# Patient Record
Sex: Female | Born: 1955 | Race: White | Hispanic: No | Marital: Married | Smoking: Former smoker
Health system: Southern US, Community
[De-identification: ages and names within clinical notes are randomized; demographics above are authoritative.]

## PROBLEM LIST (undated history)

## (undated) DIAGNOSIS — R112 Nausea with vomiting, unspecified: Secondary | ICD-10-CM

## (undated) DIAGNOSIS — M1990 Unspecified osteoarthritis, unspecified site: Secondary | ICD-10-CM

## (undated) DIAGNOSIS — E7800 Pure hypercholesterolemia, unspecified: Secondary | ICD-10-CM

## (undated) DIAGNOSIS — Z9889 Other specified postprocedural states: Secondary | ICD-10-CM

## (undated) DIAGNOSIS — F909 Attention-deficit hyperactivity disorder, unspecified type: Secondary | ICD-10-CM

## (undated) HISTORY — PX: FOOT SURGERY: SHX648

## (undated) HISTORY — PX: WRIST FRACTURE SURGERY: SHX121

## (undated) HISTORY — PX: WISDOM TOOTH EXTRACTION: SHX21

## (undated) HISTORY — DX: Nausea with vomiting, unspecified: Z98.890

## (undated) HISTORY — PX: TONSILLECTOMY: SUR1361

## (undated) HISTORY — DX: Unspecified osteoarthritis, unspecified site: M19.90

## (undated) HISTORY — DX: Nausea with vomiting, unspecified: R11.2

---

## 1999-09-11 ENCOUNTER — Encounter: Payer: Self-pay | Admitting: Obstetrics and Gynecology

## 1999-09-11 ENCOUNTER — Encounter: Admission: RE | Admit: 1999-09-11 | Discharge: 1999-09-11 | Payer: Self-pay | Admitting: Obstetrics and Gynecology

## 1999-09-17 ENCOUNTER — Other Ambulatory Visit: Admission: RE | Admit: 1999-09-17 | Discharge: 1999-09-17 | Payer: Self-pay | Admitting: Obstetrics and Gynecology

## 2008-05-11 HISTORY — PX: COLONOSCOPY: SHX174

## 2011-06-04 ENCOUNTER — Other Ambulatory Visit: Payer: Self-pay | Admitting: Obstetrics and Gynecology

## 2011-06-04 DIAGNOSIS — R928 Other abnormal and inconclusive findings on diagnostic imaging of breast: Secondary | ICD-10-CM

## 2011-06-11 ENCOUNTER — Ambulatory Visit
Admission: RE | Admit: 2011-06-11 | Discharge: 2011-06-11 | Disposition: A | Discharge status: Home / Self Care | Payer: BC Managed Care – PPO | Source: Ambulatory Visit | Attending: Obstetrics and Gynecology | Admitting: Obstetrics and Gynecology

## 2011-06-11 DIAGNOSIS — R928 Other abnormal and inconclusive findings on diagnostic imaging of breast: Secondary | ICD-10-CM

## 2012-11-25 ENCOUNTER — Other Ambulatory Visit: Payer: Self-pay | Admitting: Obstetrics and Gynecology

## 2012-11-25 DIAGNOSIS — Z780 Asymptomatic menopausal state: Secondary | ICD-10-CM

## 2013-02-27 ENCOUNTER — Ambulatory Visit
Admission: RE | Admit: 2013-02-27 | Discharge: 2013-02-27 | Disposition: A | Discharge status: Home / Self Care | Payer: BC Managed Care – PPO | Source: Ambulatory Visit | Attending: Obstetrics and Gynecology | Admitting: Obstetrics and Gynecology

## 2013-02-27 DIAGNOSIS — Z780 Asymptomatic menopausal state: Secondary | ICD-10-CM

## 2014-12-21 ENCOUNTER — Other Ambulatory Visit: Payer: Self-pay | Admitting: Obstetrics and Gynecology

## 2014-12-21 DIAGNOSIS — R928 Other abnormal and inconclusive findings on diagnostic imaging of breast: Secondary | ICD-10-CM

## 2014-12-28 ENCOUNTER — Ambulatory Visit
Admission: RE | Admit: 2014-12-28 | Discharge: 2014-12-28 | Disposition: A | Discharge status: Home / Self Care | Payer: BLUE CROSS/BLUE SHIELD | Source: Ambulatory Visit | Attending: Obstetrics and Gynecology | Admitting: Obstetrics and Gynecology

## 2014-12-28 DIAGNOSIS — R928 Other abnormal and inconclusive findings on diagnostic imaging of breast: Secondary | ICD-10-CM

## 2016-07-13 ENCOUNTER — Emergency Department (HOSPITAL_COMMUNITY)
Admission: EM | Admit: 2016-07-13 | Discharge: 2016-07-13 | Disposition: A | Discharge status: Home / Self Care | Payer: BLUE CROSS/BLUE SHIELD | Attending: Emergency Medicine | Admitting: Emergency Medicine

## 2016-07-13 ENCOUNTER — Encounter (HOSPITAL_COMMUNITY): Payer: Self-pay

## 2016-07-13 ENCOUNTER — Emergency Department (HOSPITAL_COMMUNITY): Payer: BLUE CROSS/BLUE SHIELD

## 2016-07-13 DIAGNOSIS — Z87891 Personal history of nicotine dependence: Secondary | ICD-10-CM | POA: Diagnosis not present

## 2016-07-13 DIAGNOSIS — F909 Attention-deficit hyperactivity disorder, unspecified type: Secondary | ICD-10-CM | POA: Diagnosis not present

## 2016-07-13 DIAGNOSIS — R0789 Other chest pain: Secondary | ICD-10-CM | POA: Insufficient documentation

## 2016-07-13 DIAGNOSIS — R072 Precordial pain: Secondary | ICD-10-CM | POA: Diagnosis present

## 2016-07-13 HISTORY — DX: Pure hypercholesterolemia, unspecified: E78.00

## 2016-07-13 HISTORY — DX: Attention-deficit hyperactivity disorder, unspecified type: F90.9

## 2016-07-13 LAB — I-STAT TROPONIN, ED
TROPONIN I, POC: 0 ng/mL (ref 0.00–0.08)
TROPONIN I, POC: 0 ng/mL (ref 0.00–0.08)

## 2016-07-13 LAB — BASIC METABOLIC PANEL WITH GFR
Anion gap: 8 (ref 5–15)
BUN: 17 mg/dL (ref 6–20)
CO2: 24 mmol/L (ref 22–32)
Calcium: 9.4 mg/dL (ref 8.9–10.3)
Chloride: 107 mmol/L (ref 101–111)
Creatinine, Ser: 0.88 mg/dL (ref 0.44–1.00)
GFR calc Af Amer: >60 mL/min
GFR calc non Af Amer: >60 mL/min
Glucose, Bld: 107 mg/dL — ABNORMAL HIGH (ref 65–99)
Potassium: 4.1 mmol/L (ref 3.5–5.1)
Sodium: 139 mmol/L (ref 135–145)

## 2016-07-13 LAB — CBC
HCT: 38.9 % (ref 36.0–46.0)
Hemoglobin: 12.8 g/dL (ref 12.0–15.0)
MCH: 31.2 pg (ref 26.0–34.0)
MCHC: 32.9 g/dL (ref 30.0–36.0)
MCV: 94.9 fL (ref 78.0–100.0)
PLATELETS: 216 10*3/uL (ref 150–400)
RBC: 4.1 MIL/uL (ref 3.87–5.11)
RDW: 12.2 % (ref 11.5–15.5)
WBC: 4.3 10*3/uL (ref 4.0–10.5)

## 2016-07-13 LAB — D-DIMER, QUANTITATIVE: D-Dimer, Quant: 0.29 ug/mL-FEU (ref 0.00–0.50)

## 2016-07-13 NOTE — ED Triage Notes (Signed)
Pt presents via [facility] for evaluation of central chest pressure with sudden onset today. Pt reports improvement with movement/deep breathing but states pain did persist. Pt went to [facility], given 324 ASA. No cardiac hx. Pt reports she is pain free at this time. No associated symptoms.

## 2016-07-13 NOTE — ED Notes (Signed)
Patient transported to X-ray 

## 2016-07-13 NOTE — ED Provider Notes (Signed)
[place] DEPT Provider Note   CSN: [id number] Arrival date & time: 07/13/16  1122     History   Chief Complaint Chief Complaint  Patient presents with  . Chest Pain    HPI Amanda Robles is a 61 y.o. female.   Chest Pain   This is a new problem. Episode frequency: intermittent. The problem has been resolved. The pain is associated with rest. The pain is present in the substernal region. The quality of the pain is described as pressure-like and heavy. The pain does not radiate. Pertinent negatives include no abdominal pain, no back pain, no cough, no fever, no headaches, no lower extremity edema, no malaise/fatigue, no nausea, no orthopnea, no palpitations, no shortness of breath, no sputum production and no vomiting. Risk factors include being elderly.  Her past medical history is significant for hyperlipidemia.  Pertinent negatives for past medical history include no diabetes, no DVT, no hypertension and no MI.  Pertinent negatives for family medical history include: no early MI.  Procedure history is negative for cardiac catheterization and exercise treadmill test.    Past Medical History:  Diagnosis Date  . ADHD   . Hypercholesteremia     There are no active problems to display for this patient.   Past Surgical History:  Procedure Laterality Date  . WRIST FRACTURE SURGERY     R wrist    OB History    No data available       Home Medications    Prior to Admission medications   Not on File    Family History No family history on file.  Social History Social History  Substance Use Topics  . Smoking status: Former Smoker    Types: Cigarettes    Quit date: 05/15/2010  . Smokeless tobacco: Never Used  . Alcohol use 6.0 oz/week    10 Glasses of wine per week     Allergies   Sulfa antibiotics   Review of Systems Review of Systems  Constitutional: Negative for fever and malaise/fatigue.  Respiratory: Negative for cough, sputum production and  shortness of breath.   Cardiovascular: Positive for chest pain. Negative for palpitations and orthopnea.  Gastrointestinal: Negative for abdominal pain, nausea and vomiting.  Musculoskeletal: Negative for back pain.  Neurological: Negative for headaches.  Ten systems are reviewed and are negative for acute change except as noted in the HPI    Physical Exam Updated Vital Signs BP 120/68   Pulse 67   Temp 97.9 F (36.6 C) (Oral)   Resp 17   Ht 5\' 7"  (1.702 m)   Wt 129 lb (58.5 kg)   SpO2 100%   BMI 20.20 kg/m   Physical Exam  Constitutional: She is oriented to person, place, and time. She appears well-developed and well-nourished. No distress.  HENT:  Head: Normocephalic and atraumatic.  Nose: Nose normal.  Eyes: Conjunctivae and EOM are normal. Pupils are equal, round, and reactive to light. Right eye exhibits no discharge. Left eye exhibits no discharge. No scleral icterus.  Neck: Normal range of motion. Neck supple.  Cardiovascular: Normal rate and regular rhythm.  Exam reveals no gallop and no friction rub.   No murmur heard. Pulmonary/Chest: Effort normal and breath sounds normal. No stridor. No respiratory distress. She has no rales.  Abdominal: Soft. She exhibits no distension. There is no tenderness.  Musculoskeletal: She exhibits no edema or tenderness.  Neurological: She is alert and oriented to person, place, and time.  Skin: Skin is warm and  dry. No rash noted. She is not diaphoretic. No erythema.  Psychiatric: She has a normal mood and affect.  Vitals reviewed.    ED Treatments / Results  Labs (all labs ordered are listed, but only abnormal results are displayed) Labs Reviewed  BASIC METABOLIC PANEL - Abnormal; Notable for the following:       Result Value   Glucose, Bld 107 (*)    All other components within normal limits  CBC  D-DIMER, QUANTITATIVE (NOT AT [facility])  I-STAT TROPOININ, ED  I-STAT TROPOININ, ED    EKG  EKG  Interpretation  Date/Time:  Monday July 13 2016 11:27:48 EST Ventricular Rate:  71 PR Interval:    QRS Duration: 87 QT Interval:  416 QTC Calculation: 453 R Axis:   75 Text Interpretation:  Sinus rhythm Borderline short PR interval no STEMI No old tracing to compare Confirmed by [place] ([id number]) on 07/13/2016 12:47:02 PM       Radiology Dg Chest 2 View  Result Date: 07/13/2016 CLINICAL DATA:  Chest pain. EXAM: CHEST  2 VIEW COMPARISON:  None. FINDINGS: The heart size and mediastinal contours are within normal limits. Both lungs are clear. No pneumothorax or pleural effusion is noted. The visualized skeletal structures are unremarkable. IMPRESSION: No active cardiopulmonary disease. Electronically Signed   By: Marijo Conception, M.D.   On: 07/13/2016 12:19    Procedures Procedures (including critical care time)  Medications Ordered in ED Medications - No data to display   Initial Impression / Assessment and Plan / ED Course  I have reviewed the triage vital signs and the nursing notes.  Pertinent labs & imaging results that were available during my care of the patient were reviewed by me and considered in my medical decision making (see chart for details).     Atypical chest pain. EKG without acute ischemic changes or evidence of pericarditis. Initial troponin negative. Chest x-ray without evidence suggestive of pneumonia, pneumothorax, pneumomediastinum.  No abnormal contour of the mediastinum to suggest dissection. No evidence of acute injuries. HEAR <3. Appropriate for Delta trop.  Not able to [facility]. Dimer negative. Doubt PE.  Presentation not classic for aortic dissection or esophageal perforation.   3:03 PM Delta trop negative.   The patient is safe for discharge with strict return precautions.   Final Clinical Impressions(s) / ED Diagnoses   Final diagnoses:  Atypical chest pain   Disposition: Discharge  Condition: Good  I have discussed the results,  Dx and Tx plan with the patient who expressed understanding and agree(s) with the plan. Discharge instructions discussed at great length. The patient was given strict return precautions who verbalized understanding of the instructions. No further questions at time of discharge.    New Prescriptions   No medications on file    Follow Up: primary care provider  Schedule an appointment as soon as possible for a visit  for stress testing within 30 days.      Fatima Blank, MD 07/13/16 [phone]

## 2017-06-04 ENCOUNTER — Encounter (HOSPITAL_COMMUNITY): Payer: Self-pay | Admitting: Emergency Medicine

## 2017-06-04 ENCOUNTER — Emergency Department (HOSPITAL_COMMUNITY): Payer: BLUE CROSS/BLUE SHIELD

## 2017-06-04 ENCOUNTER — Emergency Department (HOSPITAL_COMMUNITY)
Admission: EM | Admit: 2017-06-04 | Discharge: 2017-06-04 | Disposition: A | Discharge status: Home / Self Care | Payer: BLUE CROSS/BLUE SHIELD | Attending: Emergency Medicine | Admitting: Emergency Medicine

## 2017-06-04 ENCOUNTER — Other Ambulatory Visit: Payer: Self-pay

## 2017-06-04 DIAGNOSIS — Y92000 Kitchen of unspecified non-institutional (private) residence as  the place of occurrence of the external cause: Secondary | ICD-10-CM | POA: Diagnosis not present

## 2017-06-04 DIAGNOSIS — F909 Attention-deficit hyperactivity disorder, unspecified type: Secondary | ICD-10-CM | POA: Diagnosis not present

## 2017-06-04 DIAGNOSIS — S92301A Fracture of unspecified metatarsal bone(s), right foot, initial encounter for closed fracture: Secondary | ICD-10-CM | POA: Diagnosis not present

## 2017-06-04 DIAGNOSIS — Y939 Activity, unspecified: Secondary | ICD-10-CM | POA: Insufficient documentation

## 2017-06-04 DIAGNOSIS — Z87891 Personal history of nicotine dependence: Secondary | ICD-10-CM | POA: Insufficient documentation

## 2017-06-04 DIAGNOSIS — S92901A Unspecified fracture of right foot, initial encounter for closed fracture: Secondary | ICD-10-CM | POA: Diagnosis present

## 2017-06-04 DIAGNOSIS — X500XXA Overexertion from strenuous movement or load, initial encounter: Secondary | ICD-10-CM | POA: Diagnosis not present

## 2017-06-04 DIAGNOSIS — Y999 Unspecified external cause status: Secondary | ICD-10-CM | POA: Diagnosis not present

## 2017-06-04 IMAGING — DX DG HIP (WITH OR WITHOUT PELVIS) 2-3V*R*
2 series · 2 of 2 positions shown · non-contrast
Comparison: None.

CLINICAL DATA: Initial evaluation for acute trauma, fall, pain.

EXAM:
DG HIP (WITH OR WITHOUT PELVIS) 2-3V RIGHT

[pelvis ap]
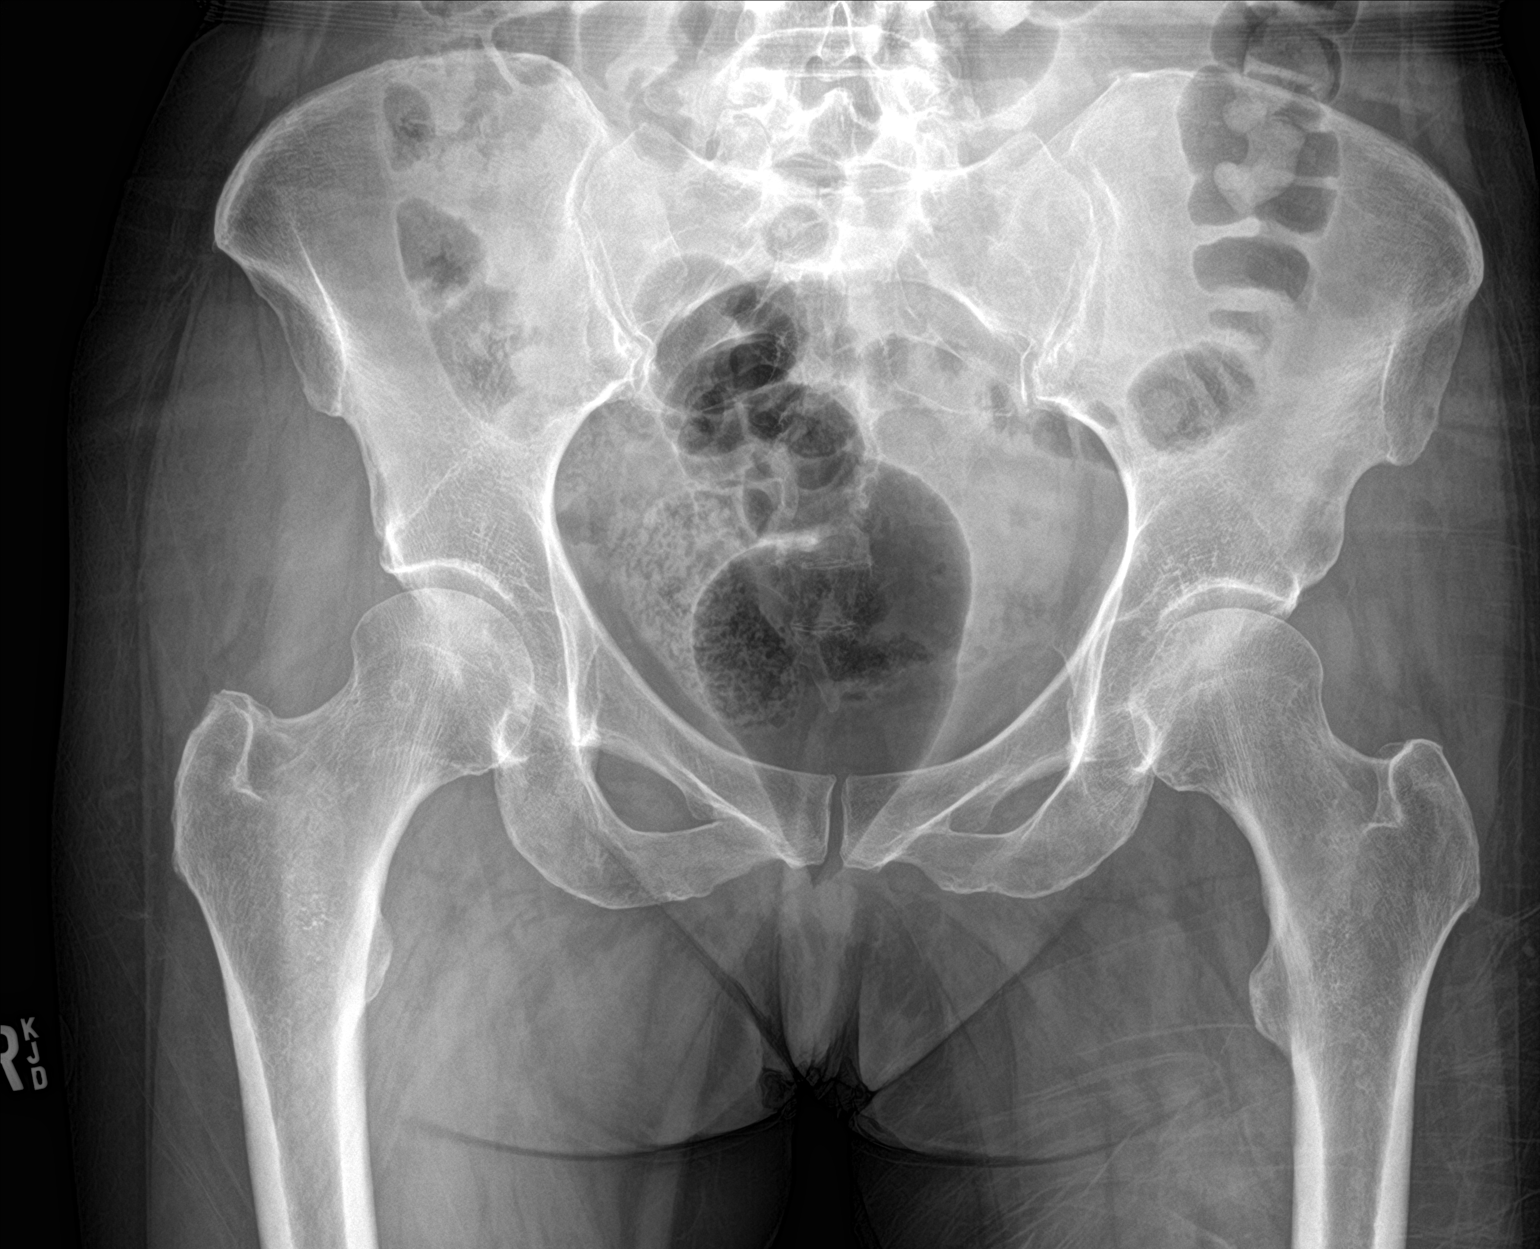

[hip lat]
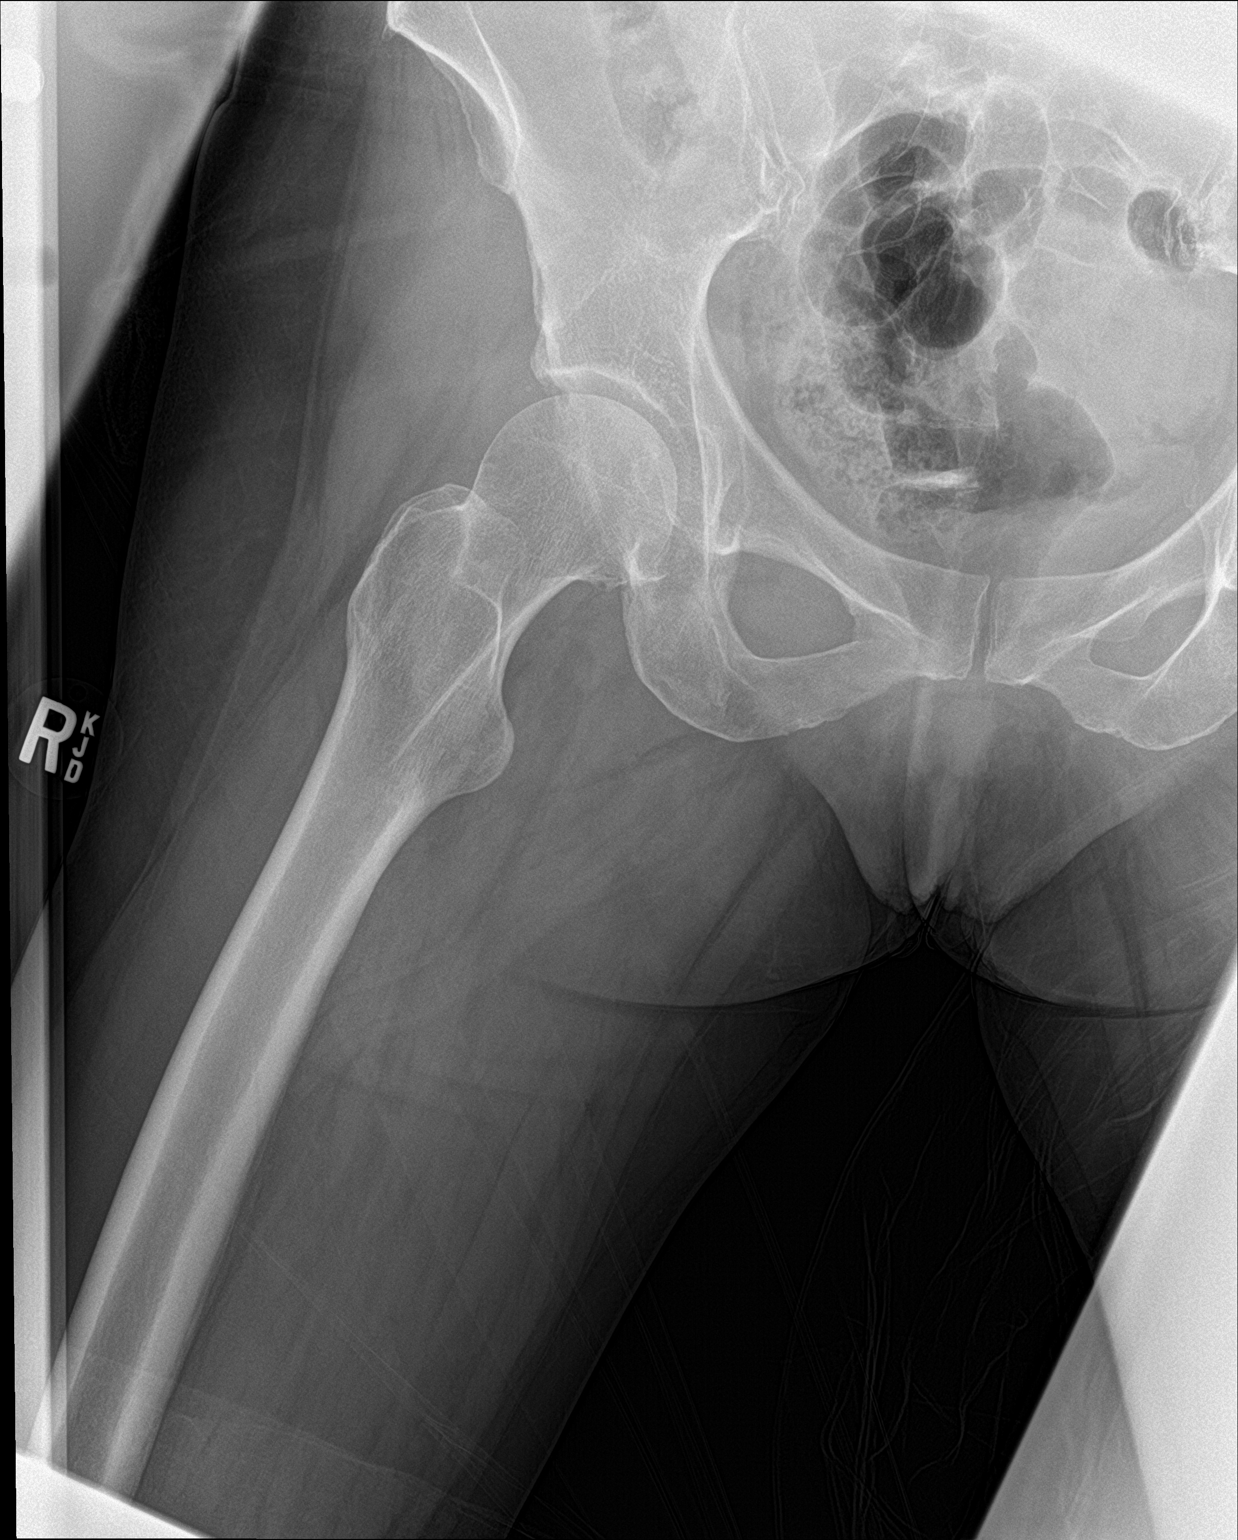

[2 of 2 positions shown; findings below may reference images not displayed]

FINDINGS: No acute fracture dislocation. Femoral head in normal alignment
within the acetabula. Femoral head height preserved. Small synovial
pit noted. Bony pelvis intact. Limited views of the left hip
demonstrate no acute abnormality. No acute soft tissue abnormality.
IMPRESSION: No acute osseous abnormality about the right hip.

## 2017-06-04 MED ORDER — ONDANSETRON 4 MG PO TBDP: 4.0000 mg | ORAL_TABLET | Freq: Four times a day (QID) | ORAL | 0 refills | Status: DC | PRN

## 2017-06-04 MED ORDER — HYDROCODONE-ACETAMINOPHEN 5-325 MG PO TABS: 2.0000 | ORAL_TABLET | Freq: Four times a day (QID) | ORAL | 0 refills | Status: DC | PRN

## 2017-06-04 MED ORDER — HYDROCODONE-ACETAMINOPHEN 5-325 MG PO TABS
2.0000 | ORAL_TABLET | Freq: Once | ORAL | Status: AC
  Administered 2017-06-04: 2 via ORAL
  Filled 2017-06-04: qty 2

## 2017-06-04 MED ORDER — ONDANSETRON 4 MG PO TBDP
4.0000 mg | ORAL_TABLET | Freq: Once | ORAL | Status: AC
Start: 2017-06-04 — End: 2017-06-04
  Administered 2017-06-04: 4 mg via ORAL
  Filled 2017-06-04: qty 1

## 2017-06-04 NOTE — ED Notes (Signed)
Dr. Leonides Schanz ( EDP) explained x-ray result and plan of care to pt.

## 2017-06-04 NOTE — ED Notes (Signed)
Ice pack applied/elevated her right foot for comfort.

## 2017-06-04 NOTE — ED Notes (Signed)
No response in lobby

## 2017-06-04 NOTE — ED Provider Notes (Signed)
TIME SEEN: 4:43 AM  CHIEF COMPLAINT: Right foot and leg pain  HPI: Patient is a 62 year old female with history of hyperlipidemia who states tonight at home she slipped on a rug in her kitchen and twisted her leg falling to the ground.  Complaining of right foot pain, right lateral and proximal tibia knee pain, right hip pain.  Has some tingling in her toes diffusely and swelling and bruising to the dorsal foot.  Did not strike her head or lose consciousness.  Denies neck or back pain.  No chest or abdominal pain.  She is not on any antiplatelets or anticoagulants.  ROS: See HPI Constitutional: no fever  Eyes: no drainage  ENT: no runny nose   Cardiovascular:  no chest pain  Resp: no SOB  GI: no vomiting GU: no dysuria Integumentary: no rash  Allergy: no hives  Musculoskeletal: no leg swelling  Neurological: no slurred speech ROS otherwise negative  PAST MEDICAL HISTORY/PAST SURGICAL HISTORY:  Past Medical History:  Diagnosis Date  . ADHD   . Hypercholesteremia     MEDICATIONS:  Prior to Admission medications   Not on File    ALLERGIES:  Allergies  Allergen Reactions  . Sulfa Antibiotics     SOCIAL HISTORY:  Social History   Tobacco Use  . Smoking status: Former Smoker    Types: Cigarettes    Last attempt to quit: 05/15/2010    Years since quitting: 7.0  . Smokeless tobacco: Never Used  Substance Use Topics  . Alcohol use: Yes    Alcohol/week: 6.0 oz    Types: 10 Glasses of wine per week    FAMILY HISTORY: No family history on file.  EXAM: BP 137/74 (BP Location: Right Arm)   Pulse 81   Temp 97.7 F (36.5 C) (Oral)   Resp 16   Ht 5' 7.5" (1.715 m)   Wt 62.6 kg (138 lb)   SpO2 100%   BMI 21.29 kg/m  CONSTITUTIONAL: Alert and oriented and responds appropriately to questions. Well-appearing; well-nourished; GCS 15 HEAD: Normocephalic; atraumatic EYES: Conjunctivae clear, PERRL, EOMI ENT: normal nose; no rhinorrhea; moist mucous membranes; pharynx  without lesions noted; no dental injury; no septal hematoma NECK: Supple, no meningismus, no LAD; no midline spinal tenderness, step-off or deformity; trachea midline CARD: RRR; S1 and S2 appreciated; no murmurs, no clicks, no rubs, no gallops RESP: Normal chest excursion without splinting or tachypnea; breath sounds clear and equal bilaterally; no wheezes, no rhonchi, no rales; no hypoxia or respiratory distress CHEST:  chest wall stable, no crepitus or ecchymosis or deformity, nontender to palpation; no flail chest ABD/GI: Normal bowel sounds; non-distended; soft, non-tender, no rebound, no guarding; no ecchymosis or other lesions noted PELVIS:  stable, nontender to palpation BACK:  The back appears normal and is non-tender to palpation, there is no CVA tenderness; no midline spinal tenderness, step-off or deformity EXT: Patient is tender to palpation over the right dorsal foot diffusely with swelling and ecchymosis.  Reports decreased sensation in the tips of her toes but otherwise normal sensation throughout the foot.  Her extremities warm and well perfused with 2+ right DP pulse.  Also tender over the right lateral proximal tibia, diffusely over the anterior right knee and over the right lateral hip.  She does have some soft tissue swelling noted to the right knee but no ecchymosis or joint effusion.  Compartments are soft.  Otherwise her extremities are nontender to palpation. SKIN: Normal color for age and race; warm NEURO: Moves  all extremities equally, sensation to light touch is intact diffusely PSYCH: The patient's mood and manner are appropriate. Grooming and personal hygiene are appropriate.  MEDICAL DECISION MAKING: Patient here after mechanical fall.  Patient has a chip fracture off the right first proximal phalanx with intra-articular extension.  She also has oblique mildly displaced fractures of the distal right second and third metatarsal bones.  Will obtain x-rays of the right tibia  and fibula, right knee and right hip.  No tenderness over the ankle or femur.  Neurovascularly intact distally other than some mild tingling in her toes likely from swelling.  No sign of compartment syndrome.  States that she normally sees [place] orth[facility]ics.  Will give Vicodin, Zofran for symptomatic relief.  No other sign of trauma on exam.  ED PROGRESS: Other x-ray showed no acute abnormality.  She has significant arthritis of her right knee which she was aware of.  We will splint her foot and have her follow-up with [facility] orthopedics as an outpatient.  Will discharge with Vicodin and Zofran.  Discussed rest, elevation, ice.   At this time, I do not feel there is any life-threatening condition present. I have reviewed and discussed all results (EKG, imaging, lab, urine as appropriate) and exam findings with patient/family. I have reviewed nursing notes and appropriate previous records.  I feel the patient is safe to be discharged home without further emergent workup and can continue workup as an outpatient as needed. Discussed usual and customary return precautions. Patient/family verbalize understanding and are comfortable with this plan.  Outpatient follow-up has been provided if needed. All questions have been answered.    SPLINT APPLICATION Date/Time: 5:91 AM Authorized by: Cyril Mourning Ward Consent: Verbal consent obtained. Risks and benefits: risks, benefits and alternatives were discussed Consent given by: patient Splint applied by: orthopedic technician Location details: right leg Splint type: right short leg posterior splint Supplies used: fiberglass Post-procedure: The splinted body part was neurovascularly unchanged following the procedure. Patient tolerance: Patient tolerated the procedure well with no immediate complications.        Ward, Delice Bison, DO 06/04/17 [phone]

## 2017-06-04 NOTE — ED Notes (Signed)
Ortho tech notified on crutches/splint orders.

## 2017-06-04 NOTE — ED Triage Notes (Signed)
Pt had a mechanical slip in her kitchen injuring her right foot. She has swelling to her toes and foot. There is swelling and discoloration.  Pt took 600mg  of ibuprofen which has decreased the pain.

## 2017-06-08 ENCOUNTER — Encounter (HOSPITAL_BASED_OUTPATIENT_CLINIC_OR_DEPARTMENT_OTHER): Payer: Self-pay | Admitting: *Deleted

## 2017-06-08 ENCOUNTER — Emergency Department (HOSPITAL_BASED_OUTPATIENT_CLINIC_OR_DEPARTMENT_OTHER): Payer: BLUE CROSS/BLUE SHIELD

## 2017-06-08 ENCOUNTER — Other Ambulatory Visit: Payer: Self-pay

## 2017-06-08 ENCOUNTER — Emergency Department (HOSPITAL_BASED_OUTPATIENT_CLINIC_OR_DEPARTMENT_OTHER)
Admission: EM | Admit: 2017-06-08 | Discharge: 2017-06-08 | Disposition: A | Discharge status: Home / Self Care | Payer: BLUE CROSS/BLUE SHIELD | Attending: Emergency Medicine | Admitting: Emergency Medicine

## 2017-06-08 DIAGNOSIS — F909 Attention-deficit hyperactivity disorder, unspecified type: Secondary | ICD-10-CM | POA: Diagnosis not present

## 2017-06-08 DIAGNOSIS — Y998 Other external cause status: Secondary | ICD-10-CM | POA: Insufficient documentation

## 2017-06-08 DIAGNOSIS — S6991XA Unspecified injury of right wrist, hand and finger(s), initial encounter: Secondary | ICD-10-CM | POA: Diagnosis not present

## 2017-06-08 DIAGNOSIS — Y9389 Activity, other specified: Secondary | ICD-10-CM | POA: Diagnosis not present

## 2017-06-08 DIAGNOSIS — Y929 Unspecified place or not applicable: Secondary | ICD-10-CM | POA: Insufficient documentation

## 2017-06-08 DIAGNOSIS — W010XXA Fall on same level from slipping, tripping and stumbling without subsequent striking against object, initial encounter: Secondary | ICD-10-CM | POA: Diagnosis not present

## 2017-06-08 DIAGNOSIS — Z87891 Personal history of nicotine dependence: Secondary | ICD-10-CM | POA: Diagnosis not present

## 2017-06-08 NOTE — ED Notes (Signed)
NAD at this time. Pt is stable and going home.  

## 2017-06-08 NOTE — Discharge Instructions (Addendum)
Follow-up with orthopedics as scheduled on Friday.

## 2017-06-08 NOTE — ED Triage Notes (Signed)
She was walking with crutches, lost her balance and fell. Injury to her right hand.

## 2017-06-08 NOTE — ED Provider Notes (Signed)
[place] EMERGENCY DEPARTMENT Provider Note   CSN: [id number] Arrival date & time: 06/08/17  1445     History   Chief Complaint Chief Complaint  Patient presents with  . Fall  . Hand Injury    HPI Amanda Robles is a 62 y.o. female.  Patient seen on 06/08/17 after a fall at home, resulting in a right foot fracture. She was discharged after splint application with crutches for ambulation. Patient states she has been having difficulty managing with the crutches, with several resulting falls. She lost her balance today while using the crutches, and fell, injuring her right hand. She has tenderness and swelling at the base of the right thumb.   The history is provided by the patient. No language interpreter was used.  Fall  This is a new problem. The problem has not changed since onset. Hand Injury      Past Medical History:  Diagnosis Date  . ADHD   . Hypercholesteremia     There are no active problems to display for this patient.   Past Surgical History:  Procedure Laterality Date  . WRIST FRACTURE SURGERY     R wrist    OB History    No data available       Home Medications    Prior to Admission medications   Medication Sig Start Date End Date Taking? Authorizing Provider  HYDROcodone-acetaminophen (NORCO/VICODIN) 5-325 MG tablet Take 2 tablets by mouth every 6 (six) hours as needed. 06/04/17   Ward, Delice Bison, DO  ondansetron (ZOFRAN ODT) 4 MG disintegrating tablet Take 1 tablet (4 mg total) by mouth every 6 (six) hours as needed for nausea or vomiting. 06/04/17   Ward, Delice Bison, DO    Family History No family history on file.  Social History Social History   Tobacco Use  . Smoking status: Former Smoker    Types: Cigarettes    Last attempt to quit: 05/15/2010    Years since quitting: 7.0  . Smokeless tobacco: Never Used  Substance Use Topics  . Alcohol use: Yes    Alcohol/week: 6.0 oz    Types: 10 Glasses of wine per week  . Drug  use: No     Allergies   Sulfa antibiotics   Review of Systems Review of Systems  All other systems reviewed and are negative.    Physical Exam Updated Vital Signs BP (!) 141/91 (BP Location: Left Arm)   Pulse (!) 116   Temp 98.3 F (36.8 C) (Oral)   Resp 18   Ht 5' 7.5" (1.715 m)   Wt 61.2 kg (135 lb)   SpO2 100%   BMI 20.83 kg/m   Physical Exam  Constitutional: She is oriented to person, place, and time. She appears well-developed and well-nourished.  HENT:  Head: Atraumatic.  Eyes: Conjunctivae are normal.  Neck: Neck supple.  Cardiovascular: Normal rate and regular rhythm.  Pulmonary/Chest: Effort normal and breath sounds normal.  Abdominal: Soft. Bowel sounds are normal.  Musculoskeletal: She exhibits tenderness. She exhibits no deformity.       Right hand: She exhibits decreased range of motion and tenderness. She exhibits normal capillary refill and no deformity.       Hands: Splint on right foot.  Neurological: She is alert and oriented to person, place, and time.  Skin: Skin is warm and dry.  Psychiatric: She has a normal mood and affect.  Nursing note and vitals reviewed.    ED Treatments / Results  Labs (all labs ordered are listed, but only abnormal results are displayed) Labs Reviewed - No data to display  EKG  EKG Interpretation None       Radiology Dg Hand Complete Right  Result Date: 06/08/2017 CLINICAL DATA:  Fall. Pain at the first metacarpal. Remote wrist injury. EXAM: RIGHT HAND - COMPLETE 3+ VIEW COMPARISON:  None. FINDINGS: Soft tissue swelling is present at the first metacarpal. There is a small bone fragment at the first [facility] joint. No definite donor site is present. The joint is located. No additional fractures are present. IMPRESSION: 1. Bone fragment at the first [facility] joint with associated soft tissues is concerning for a small avulsion. 2. No other acute fracture. Electronically Signed   By: San Morelle M.D.   On:  06/08/2017 15:18    Procedures Procedures (including critical care time)  Medications Ordered in ED Medications - No data to display   Initial Impression / Assessment and Plan / ED Course  I have reviewed the triage vital signs and the nursing notes.  Pertinent labs & imaging results that were available during my care of the patient were reviewed by me and considered in my medical decision making (see chart for details).      Patient with right thumb pain after a fall. Questionable small bone fragment at [facility] joint. Splint applied. Follow-up with orthopedics (patient has scheduled visit on Friday for previous injury to foot). Care instructions and return precautions discussed. Patient appears safe for discharge at this time.  Final Clinical Impressions(s) / ED Diagnoses   Final diagnoses:  Injury of right thumb, initial encounter    ED Discharge Orders    None       Etta Quill, NP 06/08/17 1725    Tegeler, Gwenyth Allegra, MD 06/09/17 [phone]

## 2019-08-24 ENCOUNTER — Ambulatory Visit: Payer: BC Managed Care – PPO | Attending: Internal Medicine

## 2019-08-24 DIAGNOSIS — Z23 Encounter for immunization: Secondary | ICD-10-CM

## 2019-08-24 NOTE — Progress Notes (Signed)
   Covid-19 Vaccination Clinic  Name:  Amanda Robles    MRN: [id number] DOB: [birth year]  08/24/2019  Amanda Robles was observed post Covid-19 immunization for 15 minutes without incident. She was provided with Vaccine Information Sheet and instruction to access the V-Safe system.   Amanda Robles was instructed to call 911 with any severe reactions post vaccine: Marland Kitchen Difficulty breathing  . Swelling of face and throat  . A fast heartbeat  . A bad rash all over body  . Dizziness and weakness   Immunizations Administered    Name Date Dose VIS Date Route   Pfizer COVID-19 Vaccine 08/24/2019  4:48 PM 0.3 mL 04/21/2019 Intramuscular   Manufacturer: [place]   Lot: [id number]   [place]: [id number]

## 2019-08-26 ENCOUNTER — Ambulatory Visit: Payer: BC Managed Care – PPO

## 2019-09-18 ENCOUNTER — Ambulatory Visit: Payer: BC Managed Care – PPO | Attending: Internal Medicine

## 2019-09-18 DIAGNOSIS — Z23 Encounter for immunization: Secondary | ICD-10-CM

## 2019-09-18 NOTE — Progress Notes (Signed)
   Covid-19 Vaccination Clinic  Name:  Amanda Robles    MRN: [id number] DOB: 1956/02/24  09/18/2019  [name] was observed post Covid-19 immunization for 15 minutes without incident. She was provided with Vaccine Information Sheet and instruction to access the V-Safe system.   [name] was instructed to call 911 with any severe reactions post vaccine: Marland Kitchen Difficulty breathing  . Swelling of face and throat  . A fast heartbeat  . A bad rash all over body  . Dizziness and weakness   Immunizations Administered    Name Date Dose VIS Date Route   Pfizer COVID-19 Vaccine 09/18/2019  8:51 AM 0.3 mL 07/05/2018 Intramuscular   Manufacturer: [place]   Lot: [id number]   [place]: [id number]

## 2021-03-17 ENCOUNTER — Other Ambulatory Visit: Payer: Self-pay | Admitting: Obstetrics and Gynecology

## 2021-03-17 DIAGNOSIS — E2839 Other primary ovarian failure: Secondary | ICD-10-CM

## 2021-06-18 ENCOUNTER — Encounter: Payer: Self-pay | Admitting: Gastroenterology

## 2021-07-04 ENCOUNTER — Ambulatory Visit (AMBULATORY_SURGERY_CENTER): Payer: BC Managed Care – PPO

## 2021-07-04 ENCOUNTER — Other Ambulatory Visit: Payer: Self-pay

## 2021-07-04 VITALS — Ht 67.5 in | Wt 124.0 lb

## 2021-07-04 DIAGNOSIS — Z1211 Encounter for screening for malignant neoplasm of colon: Secondary | ICD-10-CM

## 2021-07-04 MED ORDER — SUTAB 1479-225-188 MG PO TABS: 1.0000 | ORAL_TABLET | ORAL | 0 refills | Status: DC

## 2021-07-04 NOTE — Progress Notes (Signed)
No egg or soy allergy known to patient  No issues known to pt with past sedation with any surgeries or procedures Patient denies ever being told they had issues or difficulty with intubation  No FH of Malignant Hyperthermia Pt is not on diet pills Pt is not on home 02  Pt is not on blood thinners  Pt denies issues with constipation;  No A fib or A flutter Pt is fully vaccinated for Covid x 2 + booster; Medicare coupon sent to patient; NO PA's for preps discussed with pt in PV today  Discussed with pt there will be an out-of-pocket cost for prep and that varies from $0 to 70 + dollars - pt verbalized understanding  Due to the COVID-19 pandemic we are asking patients to follow certain guidelines in PV and the [place]   Pt aware of COVID protocols and [facility] guidelines  PV completed over the phone. Pt verified name, DOB, address and insurance during PV today.  Pt mailed instruction packet with copy of consent form to read and not return, and instructions.  Pt encouraged to call with questions or issues.  If pt has My chart, procedure instructions sent via My Chart   Patient requested to come pick up packet of information;

## 2021-07-18 ENCOUNTER — Encounter: Payer: Self-pay | Admitting: Gastroenterology

## 2021-07-18 ENCOUNTER — Ambulatory Visit (AMBULATORY_SURGERY_CENTER): Payer: Medicare Other | Admitting: Gastroenterology

## 2021-07-18 ENCOUNTER — Other Ambulatory Visit: Payer: Self-pay

## 2021-07-18 VITALS — BP 108/54 | HR 69 | Temp 98.4°F | Resp 11 | Ht 67.5 in | Wt 124.0 lb

## 2021-07-18 DIAGNOSIS — D125 Benign neoplasm of sigmoid colon: Secondary | ICD-10-CM

## 2021-07-18 DIAGNOSIS — K635 Polyp of colon: Secondary | ICD-10-CM | POA: Diagnosis not present

## 2021-07-18 DIAGNOSIS — K640 First degree hemorrhoids: Secondary | ICD-10-CM

## 2021-07-18 DIAGNOSIS — Z1211 Encounter for screening for malignant neoplasm of colon: Secondary | ICD-10-CM

## 2021-07-18 MED ORDER — SODIUM CHLORIDE 0.9 % IV SOLN: 500.0000 mL | INTRAVENOUS | Status: DC

## 2021-07-18 NOTE — Patient Instructions (Signed)
Resume previous medications.  1 polyp removed and sent to pathology.  Await results for final recommendations.  Handouts on findings given to patient ( hemorrhoids, polyps)  ? ?YOU HAD AN ENDOSCOPIC PROCEDURE TODAY AT [facility]:   Refer to the procedure report that was given to you for any specific questions about what was found during the examination.  If the procedure report does not answer your questions, please call your gastroenterologist to clarify.  If you requested that your care partner not be given the details of your procedure findings, then the procedure report has been included in a sealed envelope for you to review at your convenience later. ? ?YOU SHOULD EXPECT: Some feelings of bloating in the abdomen. Passage of more gas than usual.  Walking can help get rid of the air that was put into your GI tract during the procedure and reduce the bloating. If you had a lower endoscopy (such as a colonoscopy or flexible sigmoidoscopy) you may notice spotting of blood in your stool or on the toilet paper. If you underwent a bowel prep for your procedure, you may not have a normal bowel movement for a few days. ? ?Please Note:  You might notice some irritation and congestion in your nose or some drainage.  This is from the oxygen used during your procedure.  There is no need for concern and it should clear up in a day or so. ? ?SYMPTOMS TO REPORT IMMEDIATELY: ? ?Following lower endoscopy (colonoscopy or flexible sigmoidoscopy): ? Excessive amounts of blood in the stool ? Significant tenderness or worsening of abdominal pains ? Swelling of the abdomen that is new, acute ? Fever of 100?F or higher ? ? ?For urgent or emergent issues, a gastroenterologist can be reached at any hour by calling [phone]. ?Do not use MyChart messaging for urgent concerns.  ? ? ?DIET:  We do recommend a small meal at first, but then you may proceed to your regular diet.  Drink plenty of fluids but you should  avoid alcoholic beverages for 24 hours. ? ?ACTIVITY:  You should plan to take it easy for the rest of today and you should NOT DRIVE or use heavy machinery until tomorrow (because of the sedation medicines used during the test).   ? ?FOLLOW UP: ?Our staff will call the number listed on your records 48-72 hours following your procedure to check on you and address any questions or concerns that you may have regarding the information given to you following your procedure. If we do not reach you, we will leave a message.  We will attempt to reach you two times.  During this call, we will ask if you have developed any symptoms of COVID 19. If you develop any symptoms (ie: fever, flu-like symptoms, shortness of breath, cough etc.) before then, please call [phone].  If you test positive for Covid 19 in the 2 weeks post procedure, please call and report this information to [place].   ? ?If any biopsies were taken you will be contacted by phone or by letter within the next 1-3 weeks.  Please call us at [phone] if you have not heard about the biopsies in 3 weeks.  ? ? ?SIGNATURES/CONFIDENTIALITY: ?You and/or your care partner have signed paperwork which will be entered into your electronic medical record.  These signatures attest to the fact that that the information above on your After Visit Summary has been reviewed and is understood.  Full responsibility of the confidentiality  of this discharge information lies with you and/or your care-partner.  ?

## 2021-07-18 NOTE — Progress Notes (Signed)
? ?GASTROENTEROLOGY PROCEDURE H&P NOTE  ? ?Primary Care Physician: ?Ardith Dark, PA-C ? ? ? ?Reason for Procedure:  Colon Cancer screening ? ?Plan:    Colonoscopy ? ?Patient is appropriate for endoscopic procedure(s) in the ambulatory ([facility]) setting. ? ?The nature of the procedure, as well as the risks, benefits, and alternatives were carefully and thoroughly reviewed with the patient. Ample time for discussion and questions allowed. The patient understood, was satisfied, and agreed to proceed.  ? ? ? ?HPI: ?Amanda Robles is a 66 y.o. female who presents for colonoscopy for routine Colon Cancer screening.  No active GI symptoms.   ? ?Past Medical History:  ?Diagnosis Date  ? ADHD   ? Arthritis   ? fingers/ bilateral knees  ? Hypercholesteremia   ? PONV (postoperative nausea and vomiting)   ? ? ?Past Surgical History:  ?Procedure Laterality Date  ? COLONOSCOPY  2010  ? FOOT SURGERY Bilateral   ? LEFT=2020, RIGHT=2021  ? TONSILLECTOMY    ? WISDOM TOOTH EXTRACTION    ? WRIST FRACTURE SURGERY Right   ? ? ?Prior to Admission medications   ?Medication Sig Start Date End Date Taking? Authorizing Provider  ?amphetamine-dextroamphetamine (ADDERALL) 30 MG tablet Take 1 tablet by mouth 2 (two) times daily. 07/01/21  Yes [provider]  ?atorvastatin (LIPITOR) 20 MG tablet Take 20 mg by mouth daily. 05/21/21  Yes [provider]  ?CALCIUM PO Take 1 tablet by mouth daily. GUMMIES   Yes [provider]  ?Multiple Vitamin (MULTIVITAMIN) capsule Take 1 capsule by mouth daily. GUMMIES   Yes [provider]  ? ? ?Current Outpatient Medications  ?Medication Sig Dispense Refill  ? amphetamine-dextroamphetamine (ADDERALL) 30 MG tablet Take 1 tablet by mouth 2 (two) times daily.    ? atorvastatin (LIPITOR) 20 MG tablet Take 20 mg by mouth daily.    ? CALCIUM PO Take 1 tablet by mouth daily. GUMMIES    ? Multiple Vitamin (MULTIVITAMIN) capsule Take 1 capsule by mouth daily. GUMMIES     ? ?Current Facility-Administered Medications  ?Medication Dose Route Frequency Provider Last Rate Last Admin  ? 0.9 %  sodium chloride infusion  500 mL Intravenous Continuous Shateka Petrea V, DO      ? ? ?Allergies as of 07/18/2021 - Review Complete 07/18/2021  ?Allergen Reaction Noted  ? Sulfa antibiotics Other (See Comments) 10/25/2013  ? ? ?Family History  ?Problem Relation Age of Onset  ? Colon cancer Neg Hx   ? Colon polyps Neg Hx   ? Esophageal cancer Neg Hx   ? Stomach cancer Neg Hx   ? Rectal cancer Neg Hx   ? ? ?Social History  ? ?Socioeconomic History  ? Marital status: Married  ?  Spouse name: Not on file  ? Number of children: Not on file  ? Years of education: Not on file  ? Highest education level: Not on file  ?Occupational History  ? Not on file  ?Tobacco Use  ? Smoking status: Former  ?  Types: Cigarettes  ?  Quit date: 05/15/2010  ?  Years since quitting: 11.1  ? Smokeless tobacco: Never  ?Vaping Use  ? Vaping Use: Never used  ?Substance and Sexual Activity  ? Alcohol use: Yes  ?  Alcohol/week: 10.0 - 14.0 standard drinks  ?  Types: 10 - 14 Standard drinks or equivalent per week  ? Drug use: No  ? Sexual activity: Not on file  ?Other Topics Concern  ? Not on file  ?  Social History Narrative  ? Not on file  ? ?Social Determinants of Health  ? ?Financial Resource Strain: Not on file  ?Food Insecurity: Not on file  ?Transportation Needs: Not on file  ?Physical Activity: Not on file  ?Stress: Not on file  ?Social Connections: Not on file  ?Intimate Partner Violence: Not on file  ? ? ?Physical Exam: ?Vital signs in last 24 hours: ?'@BP'$  126/64   Pulse 65   Temp 98.4 ?F (36.9 ?C) (Temporal)   Resp 16   Ht 5' 7.5" (1.715 m)   Wt 124 lb (56.2 kg)   SpO2 100%   BMI 19.13 kg/m?  ?GEN: NAD ?EYE: Sclerae anicteric ?ENT: MMM ?CV: Non-tachycardic ?Pulm: CTA b/l ?GI: Soft, NT/ND ?NEURO:  Alert & Oriented x 3 ? ? ?Gerrit Heck, DO ?[facility] ? ? ?07/18/2021 11:14 AM ? ?

## 2021-07-18 NOTE — Op Note (Signed)
[facility] ?Patient Name: Amanda Robles ?Procedure Date: 07/18/2021 11:07 AM ?MRN: [id number] ?Endoscopist: Gerrit Heck , MD ?Age: 66 ?Referring MD:  ?Date of Birth: [birth year] ?Gender: Female ?Account #: [id number] ?Procedure:                Colonoscopy ?Indications:              Screening for colorectal malignant neoplasm (last  ?                          colonoscopy was more than 10 years ago) ?Medicines:                Monitored Anesthesia Care ?Procedure:                Pre-Anesthesia Assessment: ?                          - Prior to the procedure, a History and Physical  ?                          was performed, and patient medications and  ?                          allergies were reviewed. The patient's tolerance of  ?                          previous anesthesia was also reviewed. The risks  ?                          and benefits of the procedure and the sedation  ?                          options and risks were discussed with the patient.  ?                          All questions were answered, and informed consent  ?                          was obtained. Prior Anticoagulants: The patient has  ?                          taken no previous anticoagulant or antiplatelet  ?                          agents. ASA Grade Assessment: II - A patient with  ?                          mild systemic disease. After reviewing the risks  ?                          and benefits, the patient was deemed in  ?                          satisfactory condition to undergo the procedure. ?  After obtaining informed consent, the colonoscope  ?                          was passed under direct vision. Throughout the  ?                          procedure, the patient's blood pressure, pulse, and  ?                          oxygen saturations were monitored continuously. The  ?                          Olympus CF-[id number] (#[id number]) Colonoscope was  ?                          introduced through  the anus and advanced to the the  ?                          terminal ileum. The colonoscopy was performed  ?                          without difficulty. The patient tolerated the  ?                          procedure well. The quality of the bowel  ?                          preparation was good. The terminal ileum, ileocecal  ?                          valve, appendiceal orifice, and rectum were  ?                          photographed. ?Scope In: 11:22:43 AM ?Scope Out: 11:38:50 AM ?Scope Withdrawal Time: 0 hours 12 minutes 39 seconds  ?Total Procedure Duration: 0 hours 16 minutes 7 seconds  ?Findings:                 The perianal and digital rectal examinations were  ?                          normal. ?                          A 3 mm polyp was found in the sigmoid colon. The  ?                          polyp was sessile. The polyp was removed with a  ?                          cold snare. Resection and retrieval were complete.  ?                          Estimated blood loss was minimal. ?  Non-bleeding internal hemorrhoids were found during  ?                          retroflexion. The hemorrhoids were small. ?                          The exam was otherwise normal throughout the  ?                          remainder of the colon. ?                          The terminal ileum appeared normal. ?Complications:            No immediate complications. ?Estimated Blood Loss:     Estimated blood loss was minimal. ?Impression:               - One 3 mm polyp in the sigmoid colon, removed with  ?                          a cold snare. Resected and retrieved. ?                          - Non-bleeding internal hemorrhoids. ?                          - The examined portion of the ileum was normal. ?Recommendation:           - Patient has a contact number available for  ?                          emergencies. The signs and symptoms of potential  ?                          delayed complications were  discussed with the  ?                          patient. Return to normal activities tomorrow.  ?                          Written discharge instructions were provided to the  ?                          patient. ?                          - Resume previous diet. ?                          - Continue present medications. ?                          - Await pathology results. ?                          - Repeat colonoscopy for surveillance based on  ?  pathology results. ?                          - Return to GI office PRN. ?Gerrit Heck, MD ?07/18/2021 11:42:26 AM ?

## 2021-07-18 NOTE — Progress Notes (Signed)
Report given to PACU, vss 

## 2021-07-18 NOTE — Progress Notes (Signed)
Pt's states no medical or surgical changes since previsit or office visit. 

## 2021-07-22 ENCOUNTER — Telehealth: Payer: Self-pay | Admitting: *Deleted

## 2021-07-22 NOTE — Telephone Encounter (Signed)
No answer for post procedure follow up call left VM. ?

## 2021-07-22 NOTE — Telephone Encounter (Signed)
Attempted f/u phone call. No answer. Left message. °

## 2021-09-03 ENCOUNTER — Ambulatory Visit
Admission: RE | Admit: 2021-09-03 | Discharge: 2021-09-03 | Disposition: A | Discharge status: Home / Self Care | Payer: Medicare Other | Source: Ambulatory Visit | Attending: Obstetrics and Gynecology | Admitting: Obstetrics and Gynecology

## 2021-09-03 DIAGNOSIS — E2839 Other primary ovarian failure: Secondary | ICD-10-CM

## 2024-01-17 ENCOUNTER — Other Ambulatory Visit (HOSPITAL_BASED_OUTPATIENT_CLINIC_OR_DEPARTMENT_OTHER): Payer: Self-pay | Admitting: Physician Assistant

## 2024-01-17 DIAGNOSIS — Z780 Asymptomatic menopausal state: Secondary | ICD-10-CM

## 2024-01-19 ENCOUNTER — Ambulatory Visit (HOSPITAL_BASED_OUTPATIENT_CLINIC_OR_DEPARTMENT_OTHER)
Admission: RE | Admit: 2024-01-19 | Discharge: 2024-01-19 | Disposition: A | Discharge status: Home / Self Care | Payer: Medicare (Managed Care) | Source: Ambulatory Visit | Attending: Physician Assistant | Admitting: Physician Assistant

## 2024-01-19 DIAGNOSIS — Z780 Asymptomatic menopausal state: Secondary | ICD-10-CM | POA: Insufficient documentation

## 2024-01-19 DIAGNOSIS — M8580 Other specified disorders of bone density and structure, unspecified site: Secondary | ICD-10-CM | POA: Insufficient documentation
